# Patient Record
Sex: Male | Born: 1965 | Race: White | Hispanic: No | Marital: Married | Smoking: Never smoker
Health system: Southern US, Community
[De-identification: ages and names within clinical notes are randomized; demographics above are authoritative.]

## PROBLEM LIST (undated history)

## (undated) HISTORY — PX: WISDOM TOOTH EXTRACTION: SHX21

## (undated) HISTORY — PX: APPENDECTOMY: SHX54

---

## 2009-06-30 ENCOUNTER — Encounter: Admission: RE | Admit: 2009-06-30 | Discharge: 2009-06-30 | Payer: Self-pay | Admitting: *Deleted

## 2010-12-16 ENCOUNTER — Emergency Department (HOSPITAL_COMMUNITY): Payer: No Typology Code available for payment source

## 2010-12-16 ENCOUNTER — Emergency Department (HOSPITAL_COMMUNITY)
Admission: EM | Admit: 2010-12-16 | Discharge: 2010-12-16 | Disposition: A | Discharge status: Home / Self Care | Payer: No Typology Code available for payment source | Attending: Emergency Medicine | Admitting: Emergency Medicine

## 2010-12-16 DIAGNOSIS — IMO0002 Reserved for concepts with insufficient information to code with codable children: Secondary | ICD-10-CM | POA: Insufficient documentation

## 2010-12-16 DIAGNOSIS — M549 Dorsalgia, unspecified: Secondary | ICD-10-CM | POA: Insufficient documentation

## 2010-12-16 DIAGNOSIS — M79609 Pain in unspecified limb: Secondary | ICD-10-CM | POA: Insufficient documentation
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
## 2015-03-20 ENCOUNTER — Emergency Department (HOSPITAL_COMMUNITY)
Admission: EM | Admit: 2015-03-20 | Discharge: 2015-03-20 | Disposition: A | Discharge status: Home / Self Care | Payer: 59 | Attending: Emergency Medicine | Admitting: Emergency Medicine

## 2015-03-20 ENCOUNTER — Encounter (HOSPITAL_COMMUNITY): Payer: Self-pay | Admitting: Vascular Surgery

## 2015-03-20 ENCOUNTER — Emergency Department (HOSPITAL_COMMUNITY): Payer: 59

## 2015-03-20 DIAGNOSIS — J189 Pneumonia, unspecified organism: Secondary | ICD-10-CM

## 2015-03-20 DIAGNOSIS — J159 Unspecified bacterial pneumonia: Secondary | ICD-10-CM | POA: Insufficient documentation

## 2015-03-20 DIAGNOSIS — R61 Generalized hyperhidrosis: Secondary | ICD-10-CM | POA: Insufficient documentation

## 2015-03-20 DIAGNOSIS — R109 Unspecified abdominal pain: Secondary | ICD-10-CM | POA: Diagnosis present

## 2015-03-20 LAB — CBC WITH DIFFERENTIAL/PLATELET
BASOS ABS: 0 10*3/uL (ref 0.0–0.1)
BASOS PCT: 0 % (ref 0–1)
EOS ABS: 0.1 10*3/uL (ref 0.0–0.7)
EOS PCT: 1 % (ref 0–5)
HCT: 43.3 % (ref 39.0–52.0)
Hemoglobin: 14.5 g/dL (ref 13.0–17.0)
LYMPHS ABS: 2 10*3/uL (ref 0.7–4.0)
Lymphocytes Relative: 19 % (ref 12–46)
MCH: 32.1 pg (ref 26.0–34.0)
MCHC: 33.5 g/dL (ref 30.0–36.0)
MCV: 95.8 fL (ref 78.0–100.0)
Monocytes Absolute: 1.2 10*3/uL — ABNORMAL HIGH (ref 0.1–1.0)
Monocytes Relative: 12 % (ref 3–12)
Neutro Abs: 7.2 10*3/uL (ref 1.7–7.7)
Neutrophils Relative %: 68 % (ref 43–77)
PLATELETS: 224 10*3/uL (ref 150–400)
RBC: 4.52 MIL/uL (ref 4.22–5.81)
RDW: 13.5 % (ref 11.5–15.5)
WBC: 10.6 10*3/uL — AB (ref 4.0–10.5)

## 2015-03-20 LAB — I-STAT CHEM 8, ED
BUN: 24 mg/dL — ABNORMAL HIGH (ref 6–20)
CALCIUM ION: 1.13 mmol/L (ref 1.12–1.23)
CHLORIDE: 100 mmol/L — AB (ref 101–111)
Creatinine, Ser: 1.1 mg/dL (ref 0.61–1.24)
Glucose, Bld: 111 mg/dL — ABNORMAL HIGH (ref 65–99)
HCT: 47 % (ref 39.0–52.0)
Hemoglobin: 16 g/dL (ref 13.0–17.0)
Potassium: 4.1 mmol/L (ref 3.5–5.1)
SODIUM: 136 mmol/L (ref 135–145)
TCO2: 24 mmol/L (ref 0–100)

## 2015-03-20 LAB — URINALYSIS, ROUTINE W REFLEX MICROSCOPIC
BILIRUBIN URINE: NEGATIVE
Glucose, UA: NEGATIVE mg/dL
HGB URINE DIPSTICK: NEGATIVE
Ketones, ur: NEGATIVE mg/dL
Leukocytes, UA: NEGATIVE
Nitrite: NEGATIVE
PROTEIN: NEGATIVE mg/dL
Specific Gravity, Urine: 1.022 (ref 1.005–1.030)
UROBILINOGEN UA: 0.2 mg/dL (ref 0.0–1.0)
pH: 5 (ref 5.0–8.0)

## 2015-03-20 MED ORDER — HYDROMORPHONE HCL 1 MG/ML IJ SOLN
1.0000 mg | Freq: Once | INTRAMUSCULAR | Status: DC
  Filled 2015-03-20: qty 1

## 2015-03-20 MED ORDER — TRAMADOL HCL 50 MG PO TABS: 50.0000 mg | ORAL_TABLET | Freq: Four times a day (QID) | ORAL | Status: DC | PRN

## 2015-03-20 MED ORDER — LEVOFLOXACIN 500 MG PO TABS: 500.0000 mg | ORAL_TABLET | Freq: Every day | ORAL | Status: AC

## 2015-03-20 MED ORDER — AMOXICILLIN-POT CLAVULANATE 875-125 MG PO TABS: 1.0000 | ORAL_TABLET | Freq: Two times a day (BID) | ORAL | Status: DC

## 2015-03-20 MED ORDER — TRAMADOL HCL 50 MG PO TABS: 50.0000 mg | ORAL_TABLET | Freq: Four times a day (QID) | ORAL | Status: AC | PRN

## 2015-03-20 MED ORDER — ONDANSETRON HCL 4 MG/2ML IJ SOLN
4.0000 mg | Freq: Once | INTRAMUSCULAR | Status: DC
  Filled 2015-03-20: qty 2

## 2015-03-20 MED ORDER — KETOROLAC TROMETHAMINE 30 MG/ML IJ SOLN
30.0000 mg | Freq: Once | INTRAMUSCULAR | Status: AC
  Administered 2015-03-20: 30 mg via INTRAVENOUS
  Filled 2015-03-20: qty 1

## 2015-03-20 NOTE — ED Provider Notes (Signed)
CSN: [id number]     Arrival date & time 03/20/15  1312 History  This chart was scribed for non-physician practitioner, Brady Horseman, PA-C working with Tilden Fossa, MD by Doreatha Martin, ED scribe. This patient was seen in room TR02C/TR02C and the patient's care was started at 1:28 PM      Chief Complaint  Patient presents with  . Flank Pain   The history is provided by the patient. No language interpreter was used.    HPI Comments: Brady Solomon is a 49 y.o. male who presents to the Emergency Department with a chief complaint of moderate, intermittent, vague flank pain onset 3 days ago and worsened today to be a focused, sharp pain. He states associated chills, diaphoresis, difficulty sleeping secondary to pain, dark urine at baseline from drinking cranberry juice. He notes that the felt the pain and chills initially while swimming 3 days ago and had another episode of increased pain the next day. He states that pain is worsened with movement. He notes moderate relief with Ibuprofen. No Hx of renal calculi. Hx of appendectomy in 1989. Pt takes calcium and magnesium daily. He denies testicular pain, dysuria, hematuria.    History reviewed. No pertinent past medical history. Past Surgical History  Procedure Laterality Date  . Appendectomy    . Wisdom tooth extraction     No family history on file. Social History  Substance Use Topics  . Smoking status: Never Smoker   . Smokeless tobacco: None  . Alcohol Use: Yes     Comment: socially    Review of Systems  Constitutional: Positive for chills and diaphoresis.  Genitourinary: Positive for flank pain. Negative for dysuria, hematuria and testicular pain.   Allergies  Review of patient's allergies indicates not on file.  Home Medications   Prior to Admission medications   Not on File   BP 127/70 mmHg  Pulse 59  Temp(Src) 97.3 F (36.3 C) (Oral)  Resp 16  Ht 6' (1.829 m)  Wt 174 lb 11.2 oz (79.243 kg)  BMI 23.69 kg/m2  SpO2  98% Physical Exam  Constitutional: He is oriented to person, place, and time. He appears well-developed and well-nourished.  HENT:  Head: Normocephalic and atraumatic.  Eyes: Conjunctivae and EOM are normal. Pupils are equal, round, and reactive to light. Right eye exhibits no discharge. Left eye exhibits no discharge. No scleral icterus.  Neck: Normal range of motion. Neck supple. No JVD present.  Cardiovascular: Normal rate, regular rhythm and normal heart sounds.  Exam reveals no gallop and no friction rub.   No murmur heard. Pulmonary/Chest: Effort normal and breath sounds normal. No respiratory distress. He has no wheezes. He has no rales. He exhibits no tenderness.  Abdominal: Soft. He exhibits no distension and no mass. There is no tenderness. There is no rebound and no guarding.  Musculoskeletal: Normal range of motion. He exhibits no edema or tenderness.  Neurological: He is alert and oriented to person, place, and time.  Skin: Skin is warm and dry.  Psychiatric: He has a normal mood and affect. His behavior is normal. Judgment and thought content normal.  Nursing note and vitals reviewed.   ED Course  Procedures (including critical care time) DIAGNOSTIC STUDIES: Oxygen Saturation is 98% on RA, normal by my interpretation.    COORDINATION OF CARE: 1:32 PM Discussed treatment plan with pt at bedside and pt agreed to plan.   Results for orders placed or performed during the hospital encounter of 03/20/15  CBC with  Differential/Platelet  Result Value Ref Range   WBC 10.6 (H) 4.0 - 10.5 K/uL   RBC 4.52 4.22 - 5.81 MIL/uL   Hemoglobin 14.5 13.0 - 17.0 g/dL   HCT 16.1 09.6 - 04.5 %   MCV 95.8 78.0 - 100.0 fL   MCH 32.1 26.0 - 34.0 pg   MCHC 33.5 30.0 - 36.0 g/dL   RDW 40.9 81.1 - 91.4 %   Platelets 224 150 - 400 K/uL   Neutrophils Relative % 68 43 - 77 %   Neutro Abs 7.2 1.7 - 7.7 K/uL   Lymphocytes Relative 19 12 - 46 %   Lymphs Abs 2.0 0.7 - 4.0 K/uL   Monocytes  Relative 12 3 - 12 %   Monocytes Absolute 1.2 (H) 0.1 - 1.0 K/uL   Eosinophils Relative 1 0 - 5 %   Eosinophils Absolute 0.1 0.0 - 0.7 K/uL   Basophils Relative 0 0 - 1 %   Basophils Absolute 0.0 0.0 - 0.1 K/uL  Urinalysis, Routine w reflex microscopic (not at [facility])  Result Value Ref Range   Color, Urine YELLOW YELLOW   APPearance CLEAR CLEAR   Specific Gravity, Urine 1.022 1.005 - 1.030   pH 5.0 5.0 - 8.0   Glucose, UA NEGATIVE NEGATIVE mg/dL   Hgb urine dipstick NEGATIVE NEGATIVE   Bilirubin Urine NEGATIVE NEGATIVE   Ketones, ur NEGATIVE NEGATIVE mg/dL   Protein, ur NEGATIVE NEGATIVE mg/dL   Urobilinogen, UA 0.2 0.0 - 1.0 mg/dL   Nitrite NEGATIVE NEGATIVE   Leukocytes, UA NEGATIVE NEGATIVE  I-stat chem 8, ed  Result Value Ref Range   Sodium 136 135 - 145 mmol/L   Potassium 4.1 3.5 - 5.1 mmol/L   Chloride 100 (L) 101 - 111 mmol/L   BUN 24 (H) 6 - 20 mg/dL   Creatinine, Ser 7.82 0.61 - 1.24 mg/dL   Glucose, Bld 956 (H) 65 - 99 mg/dL   Calcium, Ion 2.13 0.86 - 1.23 mmol/L   TCO2 24 0 - 100 mmol/L   Hemoglobin 16.0 13.0 - 17.0 g/dL   HCT 57.8 46.9 - 62.9 %   Ct Abdomen Pelvis Wo Contrast  03/20/2015   CLINICAL DATA:  Right flank pain  EXAM: CT ABDOMEN AND PELVIS WITHOUT CONTRAST  TECHNIQUE: Multidetector CT imaging of the abdomen and pelvis was performed following the standard protocol without IV contrast.  COMPARISON:  None.  FINDINGS: Lower chest: Infiltrate in the right posterior lung base, probable pneumonia. Mild left lower lobe atelectasis. No significant pleural effusion. Heart size is upper normal  Hepatobiliary: Normal appearing liver. Normal gallbladder and bile ducts.  Pancreas: Negative  Spleen: Negative  Adrenals/Urinary Tract: Normal kidneys. No urinary tract calculi. No renal obstruction or mass. Mild bladder wall thickening may reflect chronic bladder outlet obstruction.  Stomach/Bowel: Negative for bowel obstruction. No bowel edema. Moderate stool throughout the colon.  Appendix not visualized.  Vascular/Lymphatic: Normal aorta and IVC  Reproductive: Mild to moderate prostate enlargement with prostate calcifications. Bladder wall fluid thickening suggesting chronic bladder outlet obstruction  Other: Negative for free fluid.  Negative for mass or adenopathy  Musculoskeletal: Negative  IMPRESSION: Right lower lobe infiltrate, probable pneumonia.  No acute abnormality in the abdomen.  No urinary tract calculi.  Prostate enlargement with bladder wall thickening consistent with chronic bladder outlet obstruction   Electronically Signed   By: Marlan Palau M.D.   On: 03/20/2015 14:45     Imaging Review Ct Abdomen Pelvis Wo Contrast  03/20/2015  CLINICAL DATA:  Right flank pain  EXAM: CT ABDOMEN AND PELVIS WITHOUT CONTRAST  TECHNIQUE: Multidetector CT imaging of the abdomen and pelvis was performed following the standard protocol without IV contrast.  COMPARISON:  None.  FINDINGS: Lower chest: Infiltrate in the right posterior lung base, probable pneumonia. Mild left lower lobe atelectasis. No significant pleural effusion. Heart size is upper normal  Hepatobiliary: Normal appearing liver. Normal gallbladder and bile ducts.  Pancreas: Negative  Spleen: Negative  Adrenals/Urinary Tract: Normal kidneys. No urinary tract calculi. No renal obstruction or mass. Mild bladder wall thickening may reflect chronic bladder outlet obstruction.  Stomach/Bowel: Negative for bowel obstruction. No bowel edema. Moderate stool throughout the colon. Appendix not visualized.  Vascular/Lymphatic: Normal aorta and IVC  Reproductive: Mild to moderate prostate enlargement with prostate calcifications. Bladder wall fluid thickening suggesting chronic bladder outlet obstruction  Other: Negative for free fluid.  Negative for mass or adenopathy  Musculoskeletal: Negative  IMPRESSION: Right lower lobe infiltrate, probable pneumonia.  No acute abnormality in the abdomen.  No urinary tract calculi.  Prostate  enlargement with bladder wall thickening consistent with chronic bladder outlet obstruction   Electronically Signed   By: Marlan Palau M.D.   On: 03/20/2015 14:45   I have personally reviewed and evaluated these images and lab results as part of my medical decision-making.   MDM   Final diagnoses:  Community acquired pneumonia    Patient with right flank pain and pain underneath his right ribs. Pain is worsened with deep breath. Patient has had some intermittent fevers and chills. 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 Patient has never had kidney stones, but is concerned that he may have one.  Will check CT, ua, and labs.  CT negative for KS, but does show a right lower lobe infiltrate.    Patient seen by and discussed with Dr. Fayrene Fearing, who recommends treatment with Levaquin.  I personally performed the services described in this documentation, which was scribed in my presence. The recorded information has been reviewed and is accurate.     Brady Horseman, PA-C 03/20/15 1525  Rolland Porter, MD 03/28/15 [phone]

## 2015-03-20 NOTE — Discharge Instructions (Signed)

## 2015-03-20 NOTE — ED Provider Notes (Signed)
Pt seen and evaluated.  D/W PA Browning.  C/o Rt upper abd/flank/right subcostal rib pain.  Pleuretic.  CT shows RLL lobar infiltrate.  Pt has not received Prevnar vaccine.  Will treat as CAP with Levaquin. D/W pt, all questions answered.  Rolland Porter, MD 03/20/15 [phone]

## 2015-03-20 NOTE — ED Notes (Signed)
Rob, PA at the bedside.  

## 2015-03-20 NOTE — ED Notes (Signed)
Patient returned from CT

## 2015-03-20 NOTE — ED Notes (Signed)
Right flank pain that started on Saturday. Also been having chills but is unsure if he is having fevers. Pt denies any urinary symptoms or N/V/D. Pt A&Ox4, resp e/u, and skin warm and dry.

## 2015-03-20 NOTE — ED Notes (Signed)
PA at the bedside.

## 2015-04-06 ENCOUNTER — Other Ambulatory Visit: Payer: Self-pay | Admitting: Family Medicine

## 2015-04-06 ENCOUNTER — Ambulatory Visit
Admission: RE | Admit: 2015-04-06 | Discharge: 2015-04-06 | Disposition: A | Discharge status: Home / Self Care | Payer: 59 | Source: Ambulatory Visit | Attending: Family Medicine | Admitting: Family Medicine

## 2015-04-06 DIAGNOSIS — J690 Pneumonitis due to inhalation of food and vomit: Secondary | ICD-10-CM

## 2015-07-27 DIAGNOSIS — H5213 Myopia, bilateral: Secondary | ICD-10-CM | POA: Diagnosis not present

## 2015-09-08 DIAGNOSIS — J019 Acute sinusitis, unspecified: Secondary | ICD-10-CM | POA: Diagnosis not present

## 2015-11-14 ENCOUNTER — Encounter: Payer: Self-pay | Admitting: Cardiology

## 2016-09-19 IMAGING — CT CT ABD-PELV W/O CM
2 of 4 series · 16 of 46 positions shown, 18 images · non-contrast
Comparison: None.

CLINICAL DATA: Right flank pain

EXAM:
CT ABDOMEN AND PELVIS WITHOUT CONTRAST
TECHNIQUE: Multidetector CT imaging of the abdomen and pelvis was performed
following the standard protocol without IV contrast.

[Series 2: abd/ pelvis 5.0 i30f 1 · axial · 0.65mm/px · z∈[-720,-300]mm · 13 of 92 slices shown, 15 images]
[im 4/92  soft-tissue]
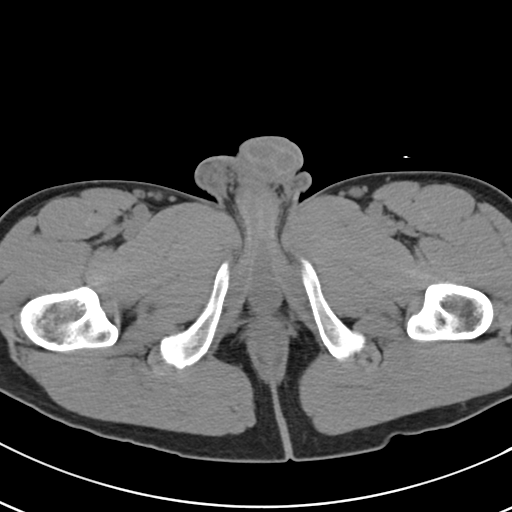
[im 4/92  bone]
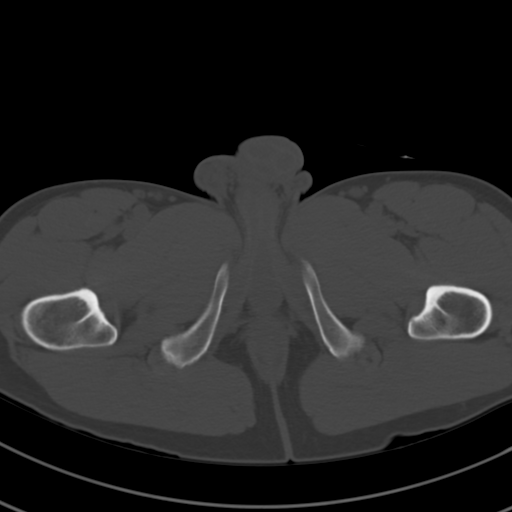
[im 11/92  soft-tissue]
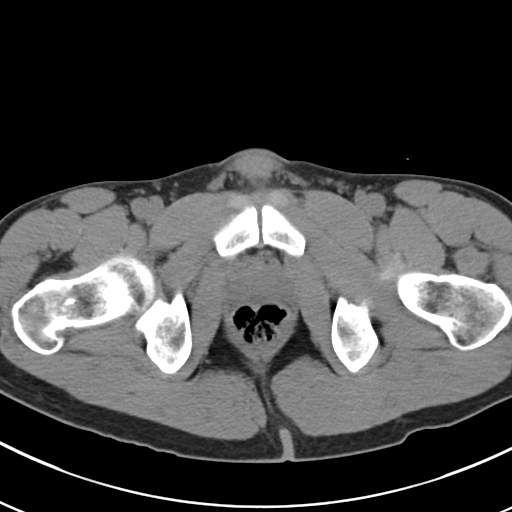
[im 19/92  soft-tissue]
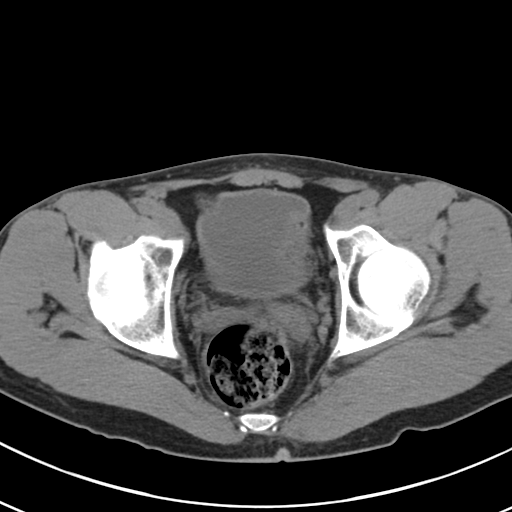
[im 26/92  soft-tissue]
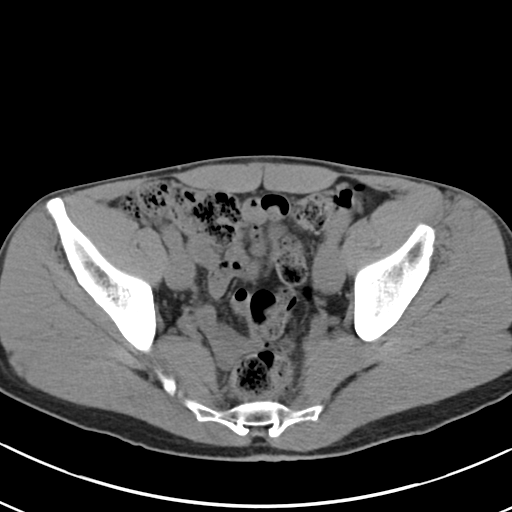
[im 33/92  soft-tissue]
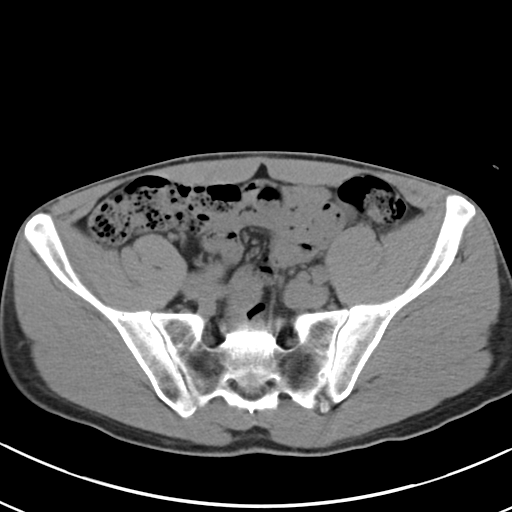
[im 41/92  soft-tissue]
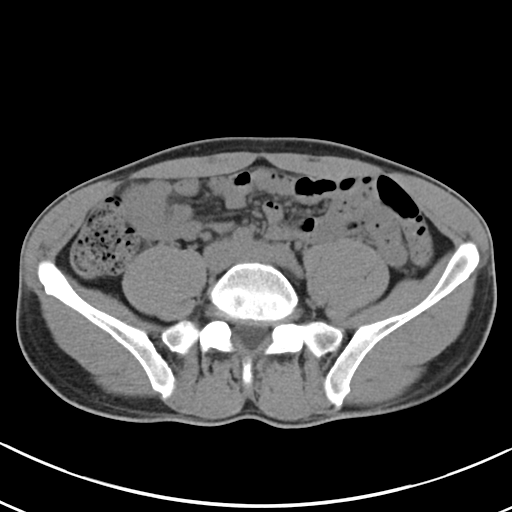
[im 48/92  soft-tissue]
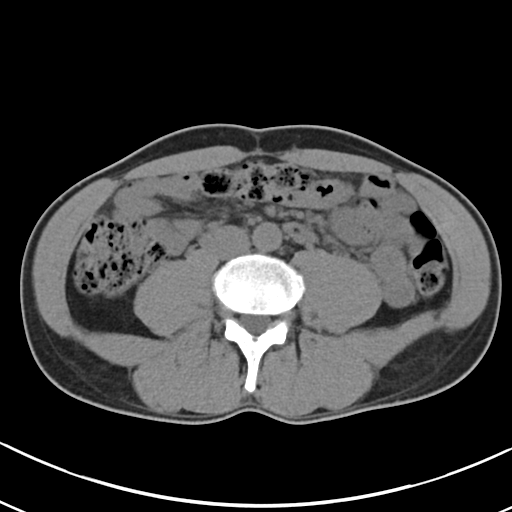
[im 51/92  soft-tissue]
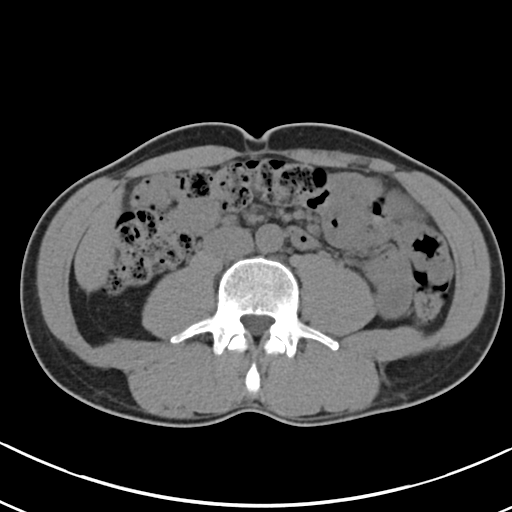
[im 59/92  soft-tissue]
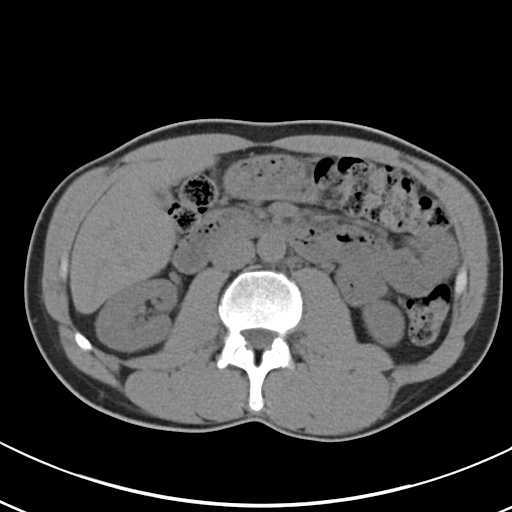
[im 59/92  bone]
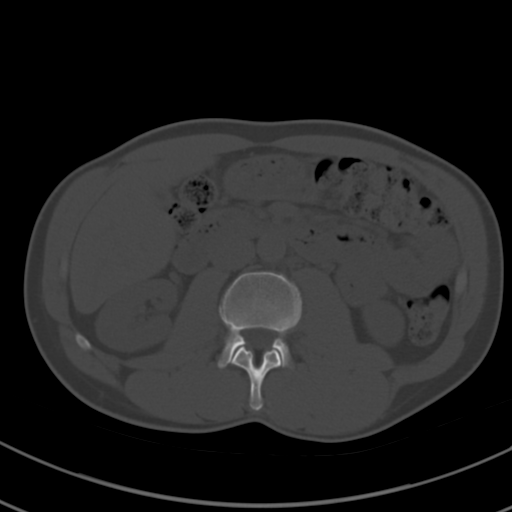
[im 66/92  soft-tissue]
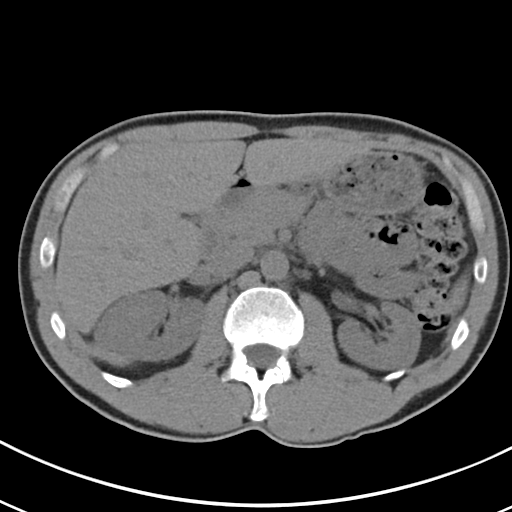
[im 73/92  soft-tissue]
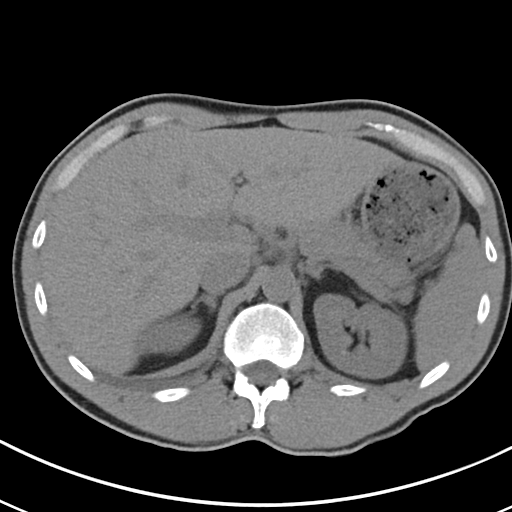
[im 81/92  soft-tissue]
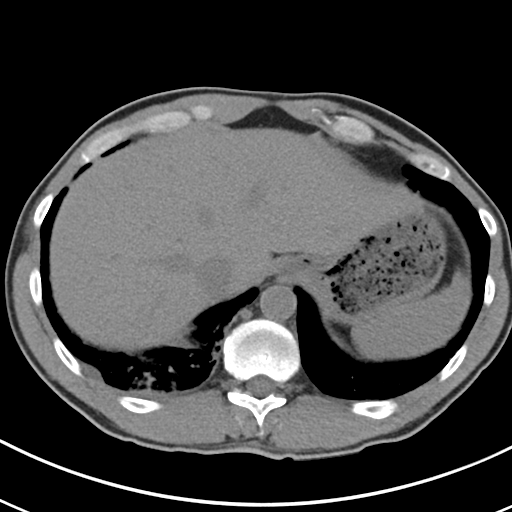
[im 88/92  soft-tissue]
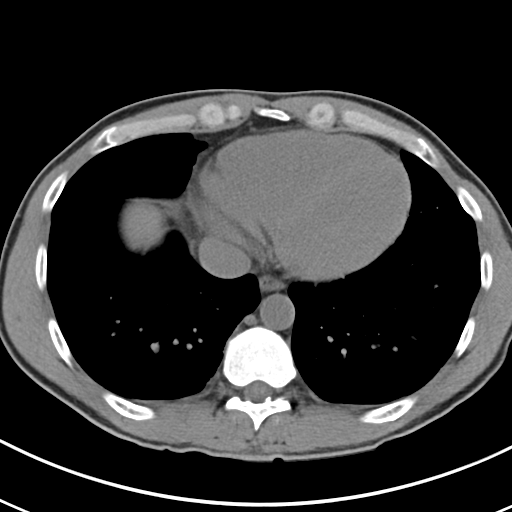

[Series 5: coronals · coronal · 0.75mm/px · 3 of 109 slices shown]
[im 37/109  soft-tissue]
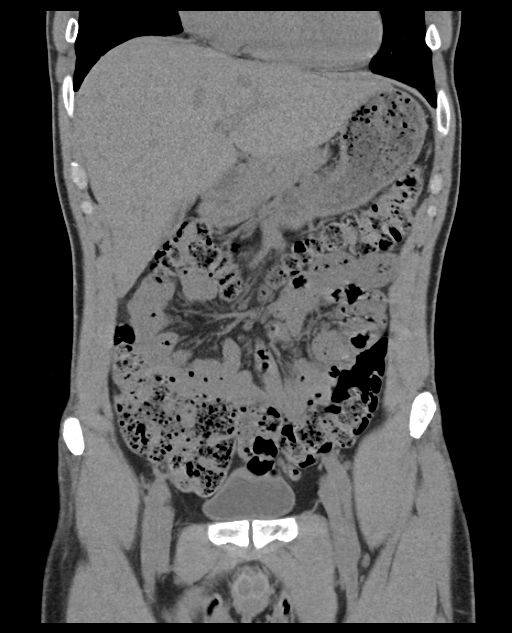
[im 49/109  soft-tissue]
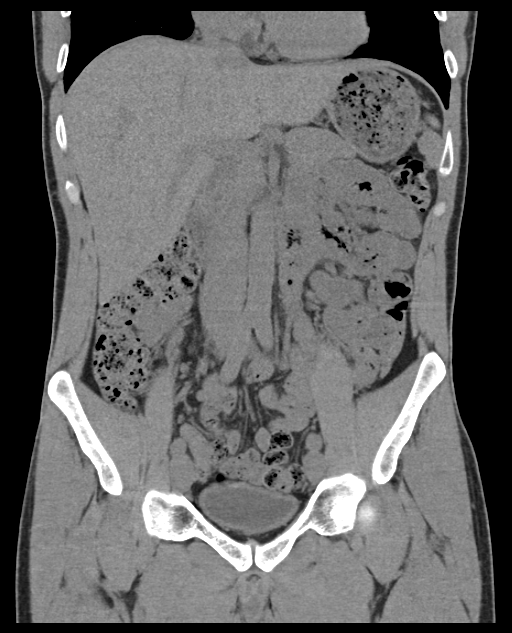
[im 61/109  soft-tissue]
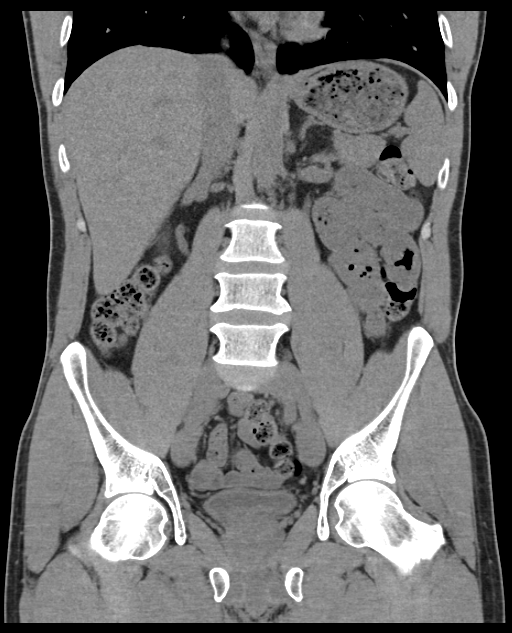

[16 of 46 positions shown; findings below may reference images not displayed]

FINDINGS: Lower chest: Infiltrate in the right posterior lung base, probable
pneumonia. Mild left lower lobe atelectasis. No significant pleural
effusion. Heart size is upper normal

Hepatobiliary: Normal appearing liver. Normal gallbladder and bile
ducts.

Pancreas: Negative

Spleen: Negative

Adrenals/Urinary Tract: Normal kidneys. No urinary tract calculi. No
renal obstruction or mass. Mild bladder wall thickening may reflect
chronic bladder outlet obstruction.

Stomach/Bowel: Negative for bowel obstruction. No bowel edema.
Moderate stool throughout the colon. Appendix not visualized.

Vascular/Lymphatic: Normal aorta and IVC

Reproductive: Mild to moderate prostate enlargement with prostate
calcifications. Bladder wall fluid thickening suggesting chronic
bladder outlet obstruction

Other: Negative for free fluid.  Negative for mass or adenopathy

Musculoskeletal: Negative
IMPRESSION: Right lower lobe infiltrate, probable pneumonia.

No acute abnormality in the abdomen.  No urinary tract calculi.

Prostate enlargement with bladder wall thickening consistent with
chronic bladder outlet obstruction

## 2016-10-06 IMAGING — CR DG CHEST 2V
2 series · 2 of 2 positions shown · non-contrast
Comparison: 12/16/2010

CLINICAL DATA: Left-sided pleuritic chest pain. Recent aspiration
pneumonia.

EXAM:
CHEST  2 VIEW

[w chest pa]
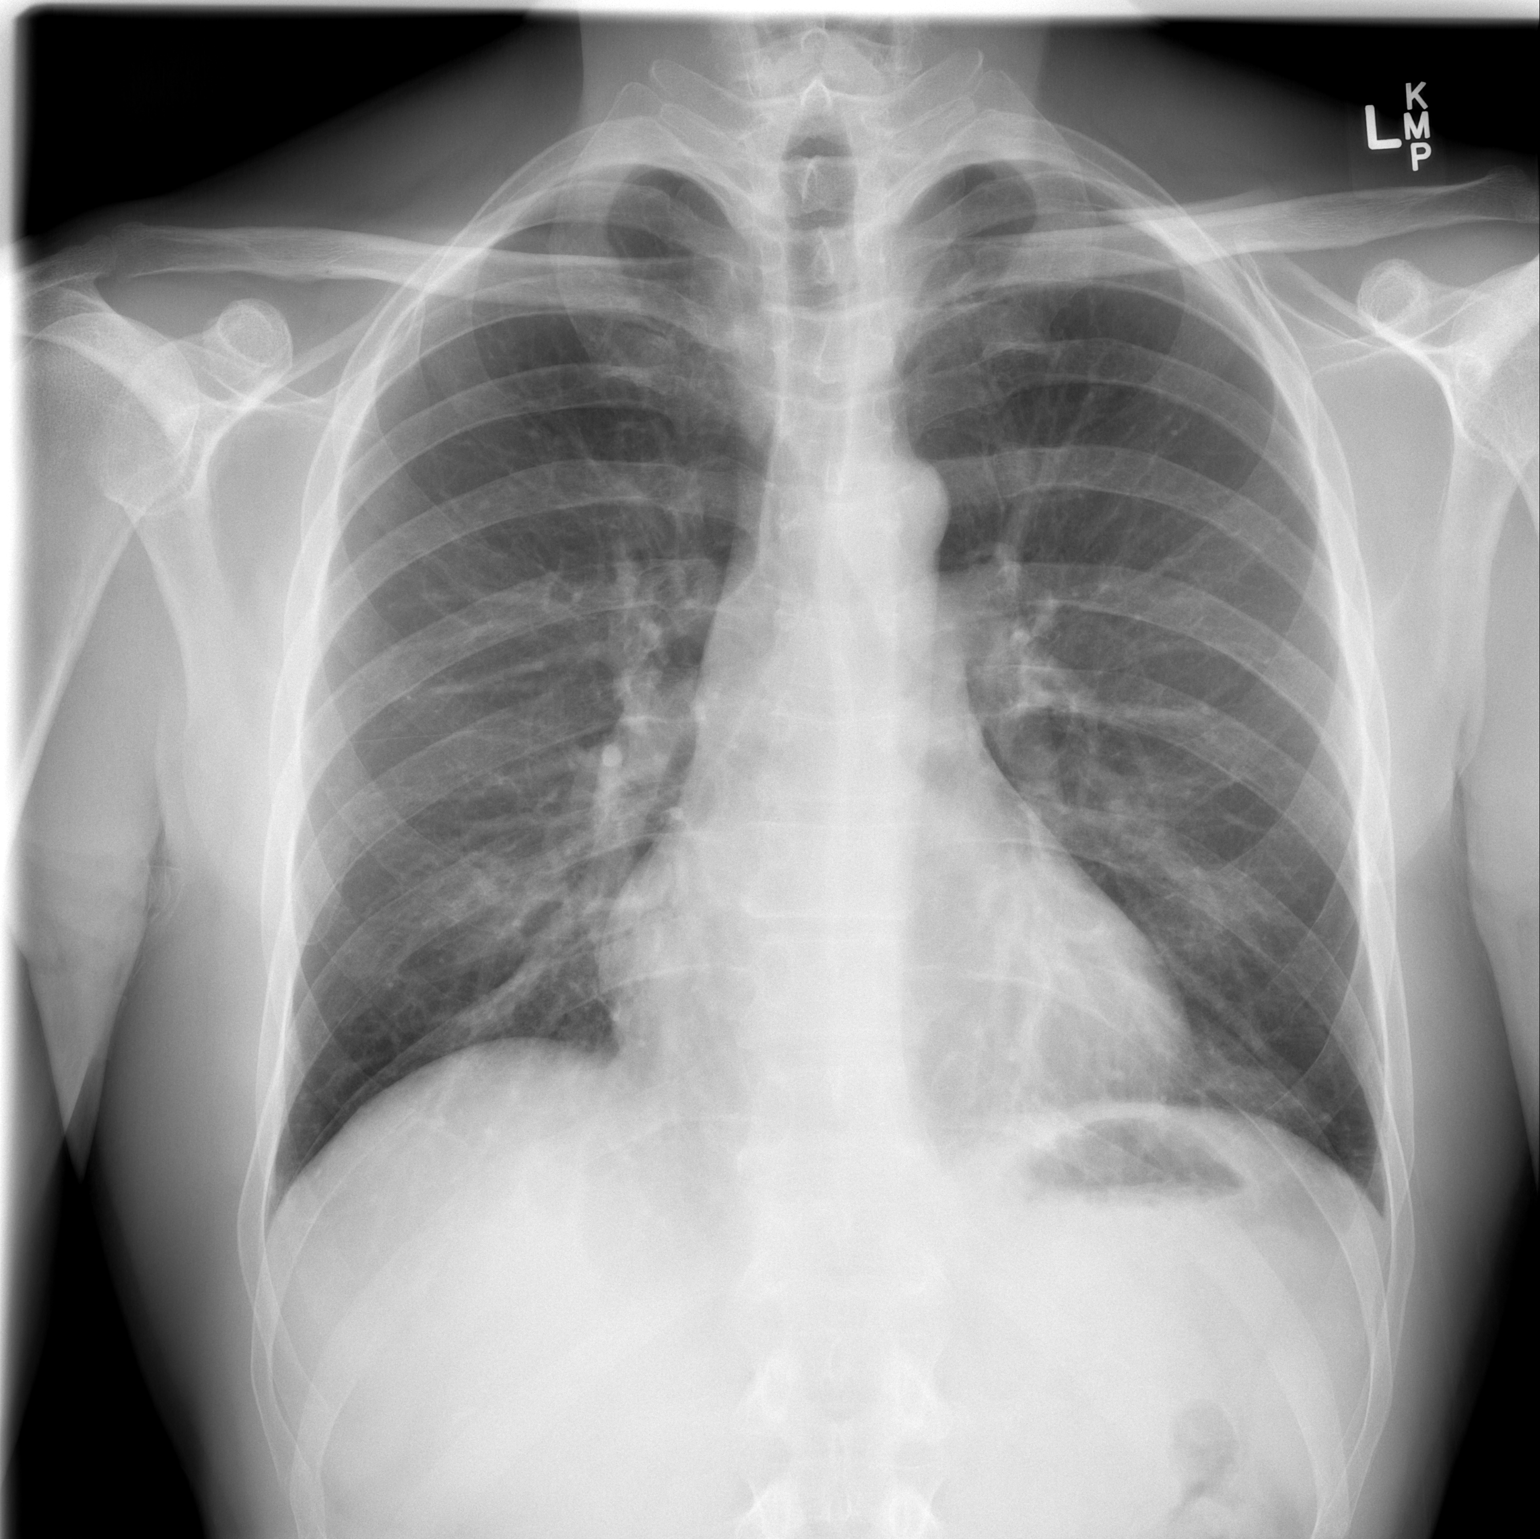

[w chest lat]
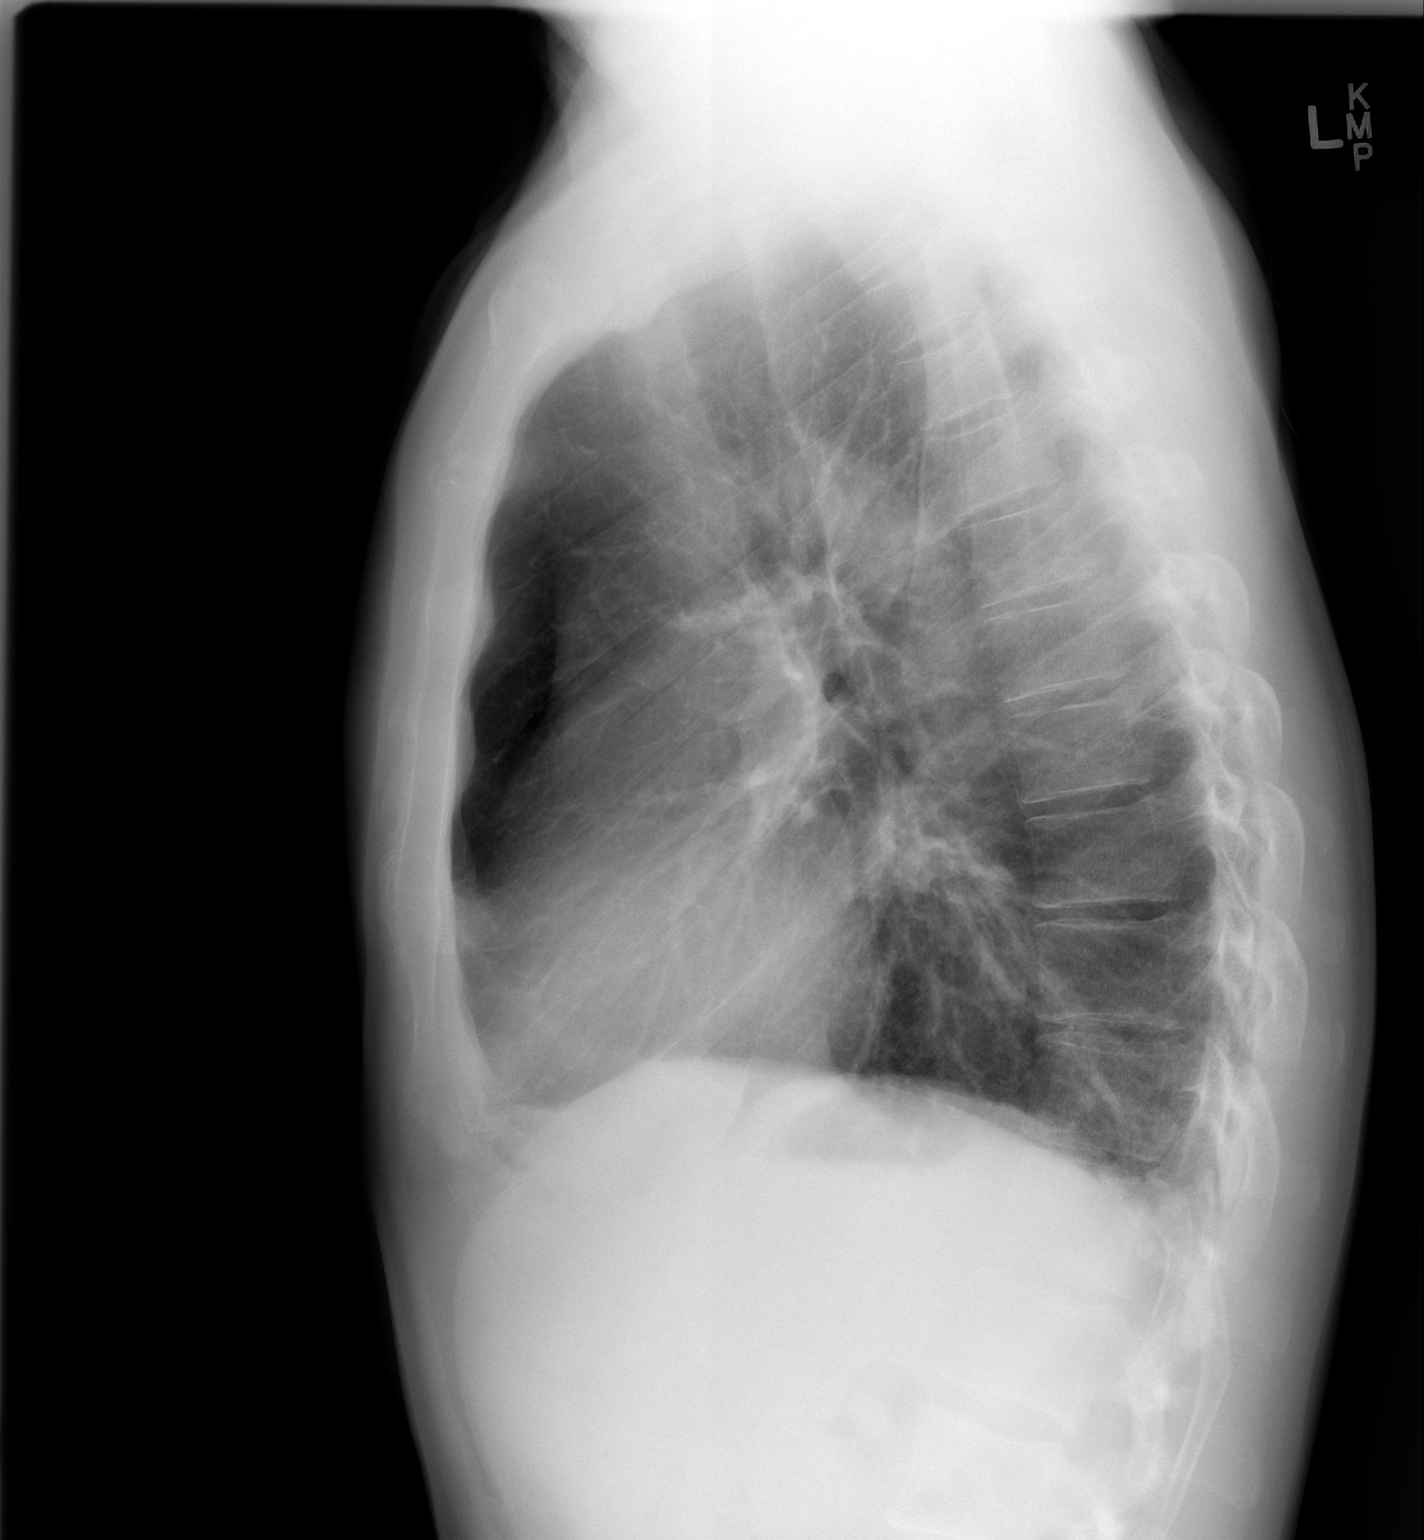

[2 of 2 positions shown; findings below may reference images not displayed]

FINDINGS: Mild infiltrate is seen in the posterior left lower lobe which is
new since previous study, and suspicious for pneumonia. No evidence
of pleural effusion or pneumothorax. Heart size and mediastinal
contours are normal.
IMPRESSION: Mild posterior left lower lobe infiltrate, suspicious for pneumonia.
Followup PA and lateral chest X-ray is recommended in 3-4 weeks
following trial of antibiotic therapy to ensure resolution.
